# Patient Record
Sex: Female | Born: 1960 | Race: Black or African American | Hispanic: No | Marital: Single | Smoking: Never smoker
Health system: Southern US, Community
[De-identification: ages and names within clinical notes are randomized; demographics above are authoritative.]

---

## 1996-08-09 HISTORY — PX: BREAST BIOPSY: SHX20

## 2005-05-11 ENCOUNTER — Ambulatory Visit: Payer: Self-pay | Admitting: General Surgery

## 2006-01-18 ENCOUNTER — Ambulatory Visit: Payer: Self-pay | Admitting: Family Medicine

## 2006-05-16 ENCOUNTER — Ambulatory Visit: Payer: Self-pay | Admitting: General Surgery

## 2008-05-01 ENCOUNTER — Ambulatory Visit: Payer: Self-pay | Admitting: Family Medicine

## 2009-05-05 ENCOUNTER — Ambulatory Visit: Payer: Self-pay | Admitting: Family Medicine

## 2011-09-08 ENCOUNTER — Ambulatory Visit: Payer: Self-pay | Admitting: Family Medicine

## 2012-07-19 ENCOUNTER — Ambulatory Visit: Payer: Self-pay | Admitting: Internal Medicine

## 2012-07-20 ENCOUNTER — Ambulatory Visit: Payer: Self-pay | Admitting: Family Medicine

## 2012-08-07 ENCOUNTER — Ambulatory Visit: Payer: Self-pay | Admitting: Family Medicine

## 2012-09-26 ENCOUNTER — Ambulatory Visit: Payer: Self-pay | Admitting: Family Medicine

## 2012-10-07 ENCOUNTER — Ambulatory Visit: Payer: Self-pay | Admitting: Family Medicine

## 2012-10-23 ENCOUNTER — Ambulatory Visit: Payer: Self-pay | Admitting: Family Medicine

## 2012-11-07 ENCOUNTER — Ambulatory Visit: Payer: Self-pay | Admitting: Family Medicine

## 2013-01-25 ENCOUNTER — Ambulatory Visit: Payer: Self-pay | Admitting: Family Medicine

## 2013-02-19 ENCOUNTER — Ambulatory Visit: Payer: Self-pay | Admitting: Gastroenterology

## 2013-03-26 ENCOUNTER — Ambulatory Visit: Payer: Self-pay | Admitting: Family Medicine

## 2013-04-09 ENCOUNTER — Ambulatory Visit: Payer: Self-pay | Admitting: Family Medicine

## 2013-04-27 ENCOUNTER — Ambulatory Visit: Payer: Self-pay | Admitting: Family Medicine

## 2013-11-12 ENCOUNTER — Ambulatory Visit: Payer: Self-pay | Admitting: Family Medicine

## 2014-11-14 ENCOUNTER — Ambulatory Visit
Admit: 2014-11-14 | Disposition: A | Discharge status: Home / Self Care | Payer: Self-pay | Attending: Family Medicine | Admitting: Family Medicine

## 2015-09-26 ENCOUNTER — Other Ambulatory Visit: Payer: Self-pay | Admitting: Family Medicine

## 2015-09-26 DIAGNOSIS — Z1231 Encounter for screening mammogram for malignant neoplasm of breast: Secondary | ICD-10-CM

## 2015-09-26 DIAGNOSIS — Z7189 Other specified counseling: Secondary | ICD-10-CM | POA: Insufficient documentation

## 2015-09-26 DIAGNOSIS — Z7185 Encounter for immunization safety counseling: Secondary | ICD-10-CM | POA: Insufficient documentation

## 2015-11-17 ENCOUNTER — Ambulatory Visit: Payer: Self-pay

## 2015-11-19 ENCOUNTER — Ambulatory Visit
Admission: RE | Admit: 2015-11-19 | Discharge: 2015-11-19 | Disposition: A | Discharge status: Home / Self Care | Payer: BLUE CROSS/BLUE SHIELD | Source: Ambulatory Visit | Attending: Family Medicine | Admitting: Family Medicine

## 2015-11-19 DIAGNOSIS — Z1231 Encounter for screening mammogram for malignant neoplasm of breast: Secondary | ICD-10-CM | POA: Diagnosis not present

## 2015-11-19 DIAGNOSIS — N6489 Other specified disorders of breast: Secondary | ICD-10-CM | POA: Insufficient documentation

## 2016-09-29 DIAGNOSIS — E669 Obesity, unspecified: Secondary | ICD-10-CM | POA: Insufficient documentation

## 2016-12-14 ENCOUNTER — Other Ambulatory Visit: Payer: Self-pay | Admitting: Family Medicine

## 2016-12-14 DIAGNOSIS — Z1231 Encounter for screening mammogram for malignant neoplasm of breast: Secondary | ICD-10-CM

## 2017-01-04 ENCOUNTER — Ambulatory Visit
Admission: RE | Admit: 2017-01-04 | Discharge: 2017-01-04 | Disposition: A | Discharge status: Home / Self Care | Payer: BLUE CROSS/BLUE SHIELD | Source: Ambulatory Visit | Attending: Family Medicine | Admitting: Family Medicine

## 2017-01-04 DIAGNOSIS — Z1231 Encounter for screening mammogram for malignant neoplasm of breast: Secondary | ICD-10-CM | POA: Diagnosis not present

## 2017-10-13 DIAGNOSIS — R7303 Prediabetes: Secondary | ICD-10-CM | POA: Insufficient documentation

## 2018-02-03 ENCOUNTER — Other Ambulatory Visit: Payer: Self-pay | Admitting: Family Medicine

## 2018-02-03 DIAGNOSIS — Z1231 Encounter for screening mammogram for malignant neoplasm of breast: Secondary | ICD-10-CM

## 2018-02-17 ENCOUNTER — Ambulatory Visit
Admission: RE | Admit: 2018-02-17 | Discharge: 2018-02-17 | Disposition: A | Discharge status: Home / Self Care | Payer: BLUE CROSS/BLUE SHIELD | Source: Ambulatory Visit | Attending: Family Medicine | Admitting: Family Medicine

## 2018-02-17 DIAGNOSIS — Z1231 Encounter for screening mammogram for malignant neoplasm of breast: Secondary | ICD-10-CM | POA: Insufficient documentation

## 2018-02-20 ENCOUNTER — Encounter: Payer: Self-pay | Admitting: Podiatry

## 2018-02-20 ENCOUNTER — Ambulatory Visit: Payer: BLUE CROSS/BLUE SHIELD | Admitting: Podiatry

## 2018-02-20 DIAGNOSIS — M2141 Flat foot [pes planus] (acquired), right foot: Secondary | ICD-10-CM

## 2018-02-20 DIAGNOSIS — M2142 Flat foot [pes planus] (acquired), left foot: Secondary | ICD-10-CM

## 2018-02-20 DIAGNOSIS — L84 Corns and callosities: Secondary | ICD-10-CM | POA: Diagnosis not present

## 2018-02-20 DIAGNOSIS — M2010 Hallux valgus (acquired), unspecified foot: Secondary | ICD-10-CM | POA: Diagnosis not present

## 2018-02-20 NOTE — Progress Notes (Signed)
This patient presents to the office for  painful calluses on both big toes.  She says that these calluses have been painful for the last 7 months.  She says that she has started exercising in the calluses have quickly developed.  She presents the office today for an evaluation and treatment of her painful calluses.  Patient is borderline diabetic.  She says that she has previously obtained orthoses through this office. She presents for an evaluation and treatment of  her painful callus.  General Appearance  Alert, conversant and in no acute stress.  Vascular  Dorsalis pedis and posterior tibial  pulses are palpable  bilaterally.  Capillary return is within normal limits  bilaterally. Temperature is within normal limits  bilaterally.  Neurologic  Senn-Weinstein monofilament wire test within normal limits  bilaterally. Muscle power within normal limits bilaterally.  Nails normal nails noted with no evidence of bacterial or fungal infection.  Orthopedic  No limitations of motion of motion feet .  No crepitus or effusions noted.  Pes  planus bilaterally.  HAV  B/L.  Hallux Interphalangeus.  B/l.  Skin  normotropic skin with no porokeratosis noted bilaterally.  No signs of infections or ulcers noted.    Pinch callus  B/L  IE  Debride callus  B/L.  Discussed formation of pinch callus both feet.   Helane GuntherGregory Judea Riches DPM

## 2018-12-04 DIAGNOSIS — F411 Generalized anxiety disorder: Secondary | ICD-10-CM | POA: Insufficient documentation

## 2019-06-11 DIAGNOSIS — K219 Gastro-esophageal reflux disease without esophagitis: Secondary | ICD-10-CM | POA: Insufficient documentation

## 2019-06-12 ENCOUNTER — Other Ambulatory Visit: Payer: Self-pay | Admitting: Family Medicine

## 2019-06-12 DIAGNOSIS — G8929 Other chronic pain: Secondary | ICD-10-CM

## 2019-06-21 ENCOUNTER — Ambulatory Visit
Admission: RE | Admit: 2019-06-21 | Discharge: 2019-06-21 | Disposition: A | Discharge status: Home / Self Care | Payer: BC Managed Care – PPO | Source: Ambulatory Visit | Attending: Family Medicine | Admitting: Family Medicine

## 2019-06-21 ENCOUNTER — Other Ambulatory Visit: Payer: Self-pay

## 2019-06-21 DIAGNOSIS — G8929 Other chronic pain: Secondary | ICD-10-CM | POA: Diagnosis present

## 2019-06-21 DIAGNOSIS — M545 Low back pain: Secondary | ICD-10-CM | POA: Diagnosis not present

## 2019-08-06 ENCOUNTER — Other Ambulatory Visit: Payer: Self-pay | Admitting: Family Medicine

## 2019-08-06 DIAGNOSIS — Z1231 Encounter for screening mammogram for malignant neoplasm of breast: Secondary | ICD-10-CM

## 2019-08-13 ENCOUNTER — Ambulatory Visit
Admission: RE | Admit: 2019-08-13 | Discharge: 2019-08-13 | Disposition: A | Discharge status: Home / Self Care | Payer: BC Managed Care – PPO | Source: Ambulatory Visit | Attending: Family Medicine | Admitting: Family Medicine

## 2019-08-13 DIAGNOSIS — Z1231 Encounter for screening mammogram for malignant neoplasm of breast: Secondary | ICD-10-CM | POA: Diagnosis present

## 2019-08-14 ENCOUNTER — Ambulatory Visit
Admission: RE | Admit: 2019-08-14 | Discharge: 2019-08-14 | Disposition: A | Discharge status: Home / Self Care | Payer: BC Managed Care – PPO | Source: Ambulatory Visit | Attending: Urology | Admitting: Urology

## 2019-08-14 ENCOUNTER — Ambulatory Visit (INDEPENDENT_AMBULATORY_CARE_PROVIDER_SITE_OTHER): Payer: BC Managed Care – PPO | Admitting: Urology

## 2019-08-14 ENCOUNTER — Other Ambulatory Visit: Payer: Self-pay

## 2019-08-14 ENCOUNTER — Encounter: Payer: Self-pay | Admitting: Urology

## 2019-08-14 DIAGNOSIS — R109 Unspecified abdominal pain: Secondary | ICD-10-CM | POA: Diagnosis not present

## 2019-08-14 DIAGNOSIS — R319 Hematuria, unspecified: Secondary | ICD-10-CM

## 2019-08-14 DIAGNOSIS — N200 Calculus of kidney: Secondary | ICD-10-CM

## 2019-08-14 DIAGNOSIS — R3129 Other microscopic hematuria: Secondary | ICD-10-CM | POA: Diagnosis not present

## 2019-08-14 DIAGNOSIS — R350 Frequency of micturition: Secondary | ICD-10-CM | POA: Diagnosis not present

## 2019-08-14 LAB — URINALYSIS, COMPLETE
Bilirubin, UA: NEGATIVE
Glucose, UA: NEGATIVE
Ketones, UA: NEGATIVE
Nitrite, UA: NEGATIVE
Protein,UA: NEGATIVE
Specific Gravity, UA: 1.02 (ref 1.005–1.030)
Urobilinogen, Ur: 4 mg/dL — ABNORMAL HIGH (ref 0.2–1.0)
pH, UA: 7 (ref 5.0–7.5)

## 2019-08-14 LAB — MICROSCOPIC EXAMINATION

## 2019-08-14 NOTE — Progress Notes (Signed)
08/14/2019 3:01 PM   Eileen Lopez 03/01/1961 [id number]  Referring provider: Dion Body, MD [facility] [place],  [place] [address]  Chief Complaint  Patient presents with  . Hematuria    HPI: 59 year old female who presents today for further evaluation of microscopic hematuria and kidney stones.  She reports that she was told about 20 years ago that she had blood in her urine.  She recalls being evaluated by urologist that time and undergoing what sounds like cystoscopy as well as possibly an IVP.  She reports that the work-up was negative.  More recently, she is have persistent microscopic blood in her urine.  She is also having right flank pain which lasted several months.  Over the past month or 2, she is now having left-sided flank pain which comes and goes.  It wraps around to her left lower quadrant.  It is unrelated to activity.  Its not exacerbated by anything.  It does not radiate to her buttock or down her back.  No associated urinary symptoms.  She had a CT of the lumbar spine without contrast on 06/21/2019 for further evaluation of her back pain primarily.  Incidentally, bilateral lower pole renal calculi were identified.  She denies a personal history of kidney stones.  No history of smoking or chemical exposures.  She is to work as a Pharmacist, hospital.  She has no urinary complaints today.   PMH: No past medical history on file.  Surgical History: Past Surgical History:  Procedure Laterality Date  . BREAST BIOPSY Right 1998   Negative    Home Medications:  Allergies as of 08/14/2019      Reactions   Tamsulosin Other (See Comments)   Amoxicillin Itching, Rash      Medication List       Accurate as of August 14, 2019 11:59 PM. If you have any questions, ask your nurse or doctor.        PROBIOTIC ADVANCED PO Take by mouth.       Allergies:  Allergies  Allergen Reactions  . Tamsulosin Other (See Comments)  .  Amoxicillin Itching and Rash    Family History: Family History  Problem Relation Age of Onset  . Breast cancer Maternal Aunt 60    Social History:  reports that she has never smoked. She has never used smokeless tobacco. She reports current alcohol use. She reports that she does not use drugs.  ROS: UROLOGY Frequent Urination?: No Hard to postpone urination?: No Burning/pain with urination?: No Get up at night to urinate?: No Leakage of urine?: No Urine stream starts and stops?: No Trouble starting stream?: No Do you have to strain to urinate?: No Blood in urine?: No Urinary tract infection?: No Sexually transmitted disease?: No Injury to kidneys or bladder?: No Painful intercourse?: No Weak stream?: No Currently pregnant?: No Vaginal bleeding?: No Last menstrual period?: n  Gastrointestinal Nausea?: No Vomiting?: No Indigestion/heartburn?: Yes Diarrhea?: No Constipation?: No  Constitutional Fever: No Night sweats?: No Weight loss?: No Fatigue?: No  Skin Skin rash/lesions?: No Itching?: No  Eyes Blurred vision?: No Double vision?: No  Ears/Nose/Throat Sore throat?: No Sinus problems?: No  Hematologic/Lymphatic Swollen glands?: No Easy bruising?: No  Cardiovascular Leg swelling?: No Chest pain?: No  Respiratory Cough?: No Shortness of breath?: No  Endocrine Excessive thirst?: No  Musculoskeletal Back pain?: No Joint pain?: No  Neurological Headaches?: No Dizziness?: No  Psychologic Depression?: No Anxiety?: No  Physical Exam: BP (!) 162/97  Pulse 93   Ht 5\' 6"  (1.676 m)   Wt 211 lb (95.7 kg)   BMI 34.06 kg/m   Constitutional:  Alert and oriented, No acute distress. HEENT: [place] AT, moist mucus membranes.  Trachea midline, no masses. Cardiovascular: No clubbing, cyanosis, or edema. Respiratory: Normal respiratory effort, no increased work of breathing. GI: Abdomen is soft, nontender, nondistended, no abdominal masses GU: No  CVA tenderness Skin: No rashes, bruises or suspicious lesions. Neurologic: Grossly intact, no focal deficits, moving all 4 extremities. Psychiatric: Normal mood and affect.  Laboratory Data: Creatinine 1.0 11/2018 Hemoglobin 13.1  Urinalysis Results for orders placed or performed in visit on 08/14/19  Microscopic Examination   URINE  Result Value Ref Range   WBC, UA 0-5 0 - 5 /hpf   RBC 11-30 (A) 0 - 2 /hpf   Epithelial Cells (non renal) 0-10 0 - 10 /hpf   Bacteria, UA Few None seen/Few  Urinalysis, Complete  Result Value Ref Range   Specific Gravity, UA 1.020 1.005 - 1.030   pH, UA 7.0 5.0 - 7.5   Color, UA Yellow Yellow   Appearance Ur Clear Clear   Leukocytes,UA Trace (A) Negative   Protein,UA Negative Negative/Trace   Glucose, UA Negative Negative   Ketones, UA Negative Negative   RBC, UA 2+ (A) Negative   Bilirubin, UA Negative Negative   Urobilinogen, Ur 4.0 (H) 0.2 - 1.0 mg/dL   Nitrite, UA Negative Negative   Microscopic Examination See below:    Multiple different urinalysis reviewed from 07/02/2019 indicating 4-10 red blood cells, 06/2019 with 4-10 as well as 12/2018 with 4-11 red blood cells.  No associated infection with these urinalyses.  Pertinent Imaging: CT of the lumbar spine was personally reviewed, stones were visualized in the lower pole but the anatomy was difficult to discern as well as stone size and density due to the nature of the study  Assessment & Plan:    1. Microscopic hematuria Given the degree of her hematuria and lack of risk factors, she falls into the intermediate risk for hematuria evaluation based on new AUA guidelines.  She would like to avoid CT scan if possible thus have recommended a KUB to evaluate her stones as well as ultrasound to rule out any underlying renal masses.  Would consider cystoscopy pending her overall stone burden and desire for stone prevention in the future.  If she ultimately elects to pursue surgical coverage  for stones, cystoscopy could be performed at that time to complete her microscopic hematuria evaluation.  See below for details. - Urinalysis, Complete - 01/2019 Renal; Future - DG Abd 1 View; Future  2. Left flank pain We discussed the nature of her left flank pain is more likely musculoskeletal however could be related to her stones.  We also discussed this though however that nonobstructing lower pole stones are typically not associated with pain.  We discussed that if we end up treating her stones, it may or may not resolve her back pain symptoms.  3. Kidney stones KUB today to assess overall stone burden need for intervention   Return for KUB today and f/u KUB/ RUS virutal visit after studies .  [place], MD  [facility] [address], [address] [place], [place] [phone]

## 2019-08-24 ENCOUNTER — Other Ambulatory Visit: Payer: Self-pay

## 2019-08-24 ENCOUNTER — Ambulatory Visit
Admission: RE | Admit: 2019-08-24 | Discharge: 2019-08-24 | Disposition: A | Discharge status: Home / Self Care | Payer: BC Managed Care – PPO | Source: Ambulatory Visit | Attending: Urology | Admitting: Urology

## 2019-08-24 DIAGNOSIS — R3129 Other microscopic hematuria: Secondary | ICD-10-CM | POA: Insufficient documentation

## 2019-08-27 ENCOUNTER — Ambulatory Visit (INDEPENDENT_AMBULATORY_CARE_PROVIDER_SITE_OTHER): Payer: BC Managed Care – PPO

## 2019-08-27 ENCOUNTER — Encounter: Payer: Self-pay | Admitting: Podiatry

## 2019-08-27 ENCOUNTER — Ambulatory Visit: Payer: BC Managed Care – PPO | Admitting: Podiatry

## 2019-08-27 ENCOUNTER — Other Ambulatory Visit: Payer: Self-pay

## 2019-08-27 DIAGNOSIS — M722 Plantar fascial fibromatosis: Secondary | ICD-10-CM | POA: Diagnosis not present

## 2019-08-27 MED ORDER — CELECOXIB 100 MG PO CAPS: 100.0000 mg | ORAL_CAPSULE | Freq: Every day | ORAL | 3 refills | Status: DC

## 2019-08-27 MED ORDER — METHYLPREDNISOLONE 4 MG PO TABS: ORAL_TABLET | ORAL | 0 refills | Status: DC

## 2019-08-27 NOTE — Patient Instructions (Signed)

## 2019-08-27 NOTE — Progress Notes (Signed)
She presents today chief complaint of painful left foot.  States that she continues to wear her orthotics on a regular basis has a new pair of shoes states that maybe her foot started hurting because she was walking 3 miles a day and an older pair shoes she states that she really cannot take ibuprofen and NSAIDs because of horrible reflux.  She denies fever chills nausea vomiting muscle aches pains trauma.  ROS: Denies fever chills nausea vomiting muscle aches pains calf pain back pain chest pain shortness of breath.  Objective: Vital signs are stable alert and oriented x3.  Pulses are palpable.  Capillary fill time is immediate.  Neurologic sensorium is intact.  Deep tendon reflexes are intact bilateral.  Muscle strength is normal and symmetrical bilateral.  Good dorsiflexion plantarflexion inversion eversion flexible pes planus is noted on physical exam mild ankle equinus is noted left.  She has pain on palpation medial calcaneal tubercle left.  Radiographs taken today demonstrate pes planus left.  Soft tissue increase in density plantar fascial kidney insertion site.  No acute findings.  Assessment: Pes planus with plantar fasciitis left.  Plan: Discussed etiology pathology conservative or surgical therapies.  We are going to try starting her on methylprednisolone 4 mg over 6 days taper and I will also try starting her on Celebrex 100 mg daily after the methylprednisolone.  I also injected the area today with 20 mg Kenalog 5 mg Marcaine point maximal tenderness of the left heel.  This is performed after Betadine skin prep.  She tolerated procedure well.  Placed in a plantar fascial brace to be followed by a night splint.  Discussed appropriate shoe gear stretching exercises ice therapy and shoe gear modifications.  I would like to follow-up with her in about 1 month to make sure she is doing well should she have questions or concerns she will notify us immediately.

## 2019-09-14 ENCOUNTER — Telehealth (INDEPENDENT_AMBULATORY_CARE_PROVIDER_SITE_OTHER): Payer: BC Managed Care – PPO | Admitting: Urology

## 2019-09-14 ENCOUNTER — Other Ambulatory Visit: Payer: Self-pay

## 2019-09-14 DIAGNOSIS — R3129 Other microscopic hematuria: Secondary | ICD-10-CM | POA: Diagnosis not present

## 2019-09-14 DIAGNOSIS — R109 Unspecified abdominal pain: Secondary | ICD-10-CM

## 2019-09-14 DIAGNOSIS — N281 Cyst of kidney, acquired: Secondary | ICD-10-CM

## 2019-09-14 DIAGNOSIS — N200 Calculus of kidney: Secondary | ICD-10-CM

## 2019-09-14 NOTE — Progress Notes (Signed)
Virtual Visit via Telephone Note  I connected with Eileen Lopez on 09/14/19 at 11:30 AM EST by telephone and verified that I am speaking with the correct person using two identifiers.  Location: Patient: Work Provider: Marketing executive,    I discussed the limitations, risks, security and privacy concerns of performing an evaluation and management service by telephone and the availability of in person appointments. I also discussed with the patient that there may be a patient responsible charge related to this service. The patient expressed understanding and agreed to proceed.   History of Present Illness: 59 year old female who presents today via virtual visit discuss her renal ultrasound results.  She was referred for microscopic hematuria as well as an incidental kidney stones on CT of the lumbar spine.  She does have intermittent left flank pain which comes and goes without any alleviating or exacerbating symptoms.  KUB indicates a 1.3 cm stone of the left lower pole as well as a 7 mm in the right lower pole.  There are also smaller nonobstructing stones on the right as well.  Renal ultrasound indicated a 1.5 cm left lower pole cyst with a small dependent calcification.  Renal ultrasound and KUB were personally reviewed.  Plan to reschedule patient.  She denies personal history of kidney stones.  She is never had surgical invention for stones.   Observations/Objective: Inquisitive, asking the questions  Assessment and Plan:  1. Microscopic hematuria We discussed the differential diagnosis of laparoscopic hematuria again today  May be related to kidney stones however would recommend bladder evaluation.  If she elects to pursue ureteroscopy, can evaluate the bladder intraoperatively.  If not, I would recommend office cystoscopy to which she is agreeable.  To let us know how she like to proceed as below.  2. Left flank pain Lengthy discussion today about the intermittent nature of her left  flank pain  Discussed the natural history of generally do not cause discomfort but there is no way to 100% rule out a kidney stone the etiology of her discomfort intolerance reviewed.  I was very clear today that I do not suspect this is the etiology of her pain.  3. Kidney stones Relatively large kidney stone burden, left greater than right as above  We discussed various management options including observation to assess for interval growth, ureteroscopy as well as staged ESWL.  Risk and benefits of each were discussed.  Given that she has numerous stents bilaterally, would likely recommend bilateral ureteroscopy.  Risks and benefits of ureteroscopy were reviewed including but not limited to infection, bleeding, pain, ureteral injury which could require open surgery versus prolonged indwelling if ureteral perforation occurs, persistent stone disease, requirement for staged procedure, possible stent, and global anesthesia risks. Patient expressed understanding and desires to proceed with ureteroscopy.  She will let us know if and when she like to pursue this otherwise will consider office cystoscopy.  We briefly discussed today dietary intervention for stone prevention.  4. Renal cyst Incidental renal cyst, dependent calcification likely proximity of stone  If she does elect to pursue ureteroscopy, can follow this up with her postoperative renal ultrasound to help clarify this issue   Follow Up Instructions: Patient will sign up for my chart today and let us know early next week how she like to proceed   I discussed the assessment and treatment plan with the patient. The patient was provided an opportunity to ask questions and all were answered. The patient agreed with the plan and demonstrated  an understanding of the instructions.   The patient was advised to call back or seek an in-person evaluation if the symptoms worsen or if the condition fails to improve as anticipated.  I provided  22 minutes of non-face-to-face time during this encounter.   Vanna Scotland, MD

## 2019-09-19 ENCOUNTER — Telehealth: Payer: Self-pay | Admitting: *Deleted

## 2019-09-19 NOTE — Telephone Encounter (Addendum)
Spoke with patient, she would like to proceed with Ureteroscopy.   ----- Message from Vanna Scotland, MD sent at 09/16/2019  2:18 PM EST ----- Would you mind following up this nice lady sometime this week?  She is thinking about pursuing bilateral ureteroscopy.  If she does not want to do this, then we need to schedule her for cystoscopy.

## 2019-09-21 ENCOUNTER — Other Ambulatory Visit: Payer: Self-pay | Admitting: Radiology

## 2019-09-21 DIAGNOSIS — N200 Calculus of kidney: Secondary | ICD-10-CM

## 2019-09-24 ENCOUNTER — Encounter: Payer: Self-pay | Admitting: Podiatry

## 2019-09-24 ENCOUNTER — Other Ambulatory Visit: Payer: Self-pay

## 2019-09-24 ENCOUNTER — Ambulatory Visit: Payer: BC Managed Care – PPO | Admitting: Podiatry

## 2019-09-24 DIAGNOSIS — M722 Plantar fascial fibromatosis: Secondary | ICD-10-CM | POA: Diagnosis not present

## 2019-09-24 NOTE — Progress Notes (Signed)
She presents today for follow-up of her plantar fasciitis she states that is feeling about 60% improved.  Objective: Vital signs are stable alert and oriented x3.  She has minimal pain on the palpation medial calcaneal tubercle of the left foot.  Assessment: Resolving plantar fasciitis by about 60% left heel.  Plan: Because she does not want to take medications i.e. the Celebrex or the methylprednisolone that I had prescribed we went ahead and reinjected the left heel today with 20 mg Kenalog 5 mg Marcaine to the point of maximal tenderness left.  She tolerated procedure well without complications I will follow-up with her in about a month.

## 2019-09-25 ENCOUNTER — Other Ambulatory Visit: Payer: Self-pay

## 2019-09-25 DIAGNOSIS — R3129 Other microscopic hematuria: Secondary | ICD-10-CM

## 2019-09-26 ENCOUNTER — Other Ambulatory Visit: Payer: BC Managed Care – PPO

## 2019-10-01 ENCOUNTER — Other Ambulatory Visit: Payer: BC Managed Care – PPO

## 2019-10-02 ENCOUNTER — Other Ambulatory Visit: Payer: BC Managed Care – PPO

## 2019-10-04 ENCOUNTER — Encounter: Admission: RE | Payer: Self-pay | Source: Home / Self Care

## 2019-10-04 ENCOUNTER — Ambulatory Visit: Admission: RE | Admit: 2019-10-04 | Payer: BC Managed Care – PPO | Source: Home / Self Care | Admitting: Urology

## 2019-10-04 SURGERY — CYSTOSCOPY/URETEROSCOPY/HOLMIUM LASER/STENT PLACEMENT
Anesthesia: Choice | Laterality: Bilateral

## 2019-10-22 ENCOUNTER — Encounter: Payer: BC Managed Care – PPO | Admitting: Podiatry

## 2019-12-27 DIAGNOSIS — Z8249 Family history of ischemic heart disease and other diseases of the circulatory system: Secondary | ICD-10-CM | POA: Insufficient documentation

## 2019-12-27 DIAGNOSIS — R030 Elevated blood-pressure reading, without diagnosis of hypertension: Secondary | ICD-10-CM | POA: Insufficient documentation

## 2020-01-09 ENCOUNTER — Ambulatory Visit: Payer: BC Managed Care – PPO | Admitting: Podiatry

## 2020-01-09 ENCOUNTER — Other Ambulatory Visit: Payer: Self-pay

## 2020-01-09 DIAGNOSIS — M722 Plantar fascial fibromatosis: Secondary | ICD-10-CM

## 2020-01-09 DIAGNOSIS — M67471 Ganglion, right ankle and foot: Secondary | ICD-10-CM | POA: Diagnosis not present

## 2020-01-09 NOTE — Patient Instructions (Signed)
Pre-Operative Instructions  Congratulations, you have decided to take an important step towards improving your quality of life.  You can be assured that the doctors and staff at [facility] will be with you every step of the way.  Here are some important things you should know:  1. Plan to be at the surgery center/hospital at least 1 (one) hour prior to your scheduled time, unless otherwise directed by the surgical center/hospital staff.  You must have a responsible adult accompany you, remain during the surgery and drive you home.  Make sure you have directions to the surgical center/hospital to ensure you arrive on time. 2. If you are having surgery at [facility] or [facility], you will need a copy of your medical history and physical form from your family physician within one month prior to the date of surgery. We will give you a form for your primary physician to complete.  3. We make every effort to accommodate the date you request for surgery.  However, there are times where surgery dates or times have to be moved.  We will contact you as soon as possible if a change in schedule is required.   4. No aspirin/ibuprofen for one week before surgery.  If you are on aspirin, any non-steroidal anti-inflammatory medications (Mobic, Aleve, Ibuprofen) should not be taken seven (7) days prior to your surgery.  You make take Tylenol for pain prior to surgery.  5. Medications - If you are taking daily heart and blood pressure medications, seizure, reflux, allergy, asthma, anxiety, pain or diabetes medications, make sure you notify the surgery center/hospital before the day of surgery so they can tell you which medications you should take or avoid the day of surgery. 6. No food or drink after midnight the night before surgery unless directed otherwise by surgical center/hospital staff. 7. No alcoholic beverages 24-hours prior to surgery.  No smoking 24-hours prior or 24-hours after  surgery. 8. Wear loose pants or shorts. They should be loose enough to fit over bandages, boots, and casts. 9. Don't wear slip-on shoes. Sneakers are preferred. 10. Bring your boot with you to the surgery center/hospital.  Also bring crutches or a walker if your physician has prescribed it for you.  If you do not have this equipment, it will be provided for you after surgery. 11. If you have not been contacted by the surgery center/hospital by the day before your surgery, call to confirm the date and time of your surgery. 12. Leave-time from work may vary depending on the type of surgery you have.  Appropriate arrangements should be made prior to surgery with your employer. 13. Prescriptions will be provided immediately following surgery by your doctor.  Fill these as soon as possible after surgery and take the medication as directed. Pain medications will not be refilled on weekends and must be approved by the doctor. 14. Remove nail polish on the operative foot and avoid getting pedicures prior to surgery. 15. Wash the night before surgery.  The night before surgery wash the foot and leg well with water and the antibacterial soap provided. Be sure to pay special attention to beneath the toenails and in between the toes.  Wash for at least three (3) minutes. Rinse thoroughly with water and dry well with a towel.  Perform this wash unless told not to do so by your physician.  Enclosed: 1 Ice pack (please put in freezer the night before surgery)   1 Hibiclens skin cleaner     Pre-op instructions  If you have any questions regarding the instructions, please do not hesitate to call our office.  [place]: [address], [place], [place] [address] -- [phone]  [place]: [address] [place], [place] [address] -- [phone]  [place]: [address], [place], [place] [address] -- [phone]   Website: [email or web address] 

## 2020-01-11 ENCOUNTER — Telehealth: Payer: Self-pay

## 2020-01-11 NOTE — Telephone Encounter (Signed)
Received paperwork from Dr. Al Corpus for surgery. Left a message for the patient to call so we could get her surgery scheduled.

## 2020-01-11 NOTE — Progress Notes (Signed)
She presents today states my heel started hurting again about 3 to 4 weeks ago as she refers to the plantar fasciitis of the left heel.  She is also concerned about the large ganglion to the hallux right.  She states that her toe starting to turn to the side the bump is becoming more painful.  Objective: Vital signs are stable she is alert and oriented x3.  Pulses are palpable.  I have reviewed her past medical history medications allergies surgeries and social history.  Right hallux demonstrates hallux interphalangeal with a large 1 cm x 2.0 cm nonpulsatile ganglion type lesion medial aspect of the hallux.  It is fluctuant and translucent.  Left heel demonstrates pain to palpation medial calcaneal tubercle.  Assessment: Chronic intractable plantar fasciitis left.  Ganglion cyst with hallux interphalangeal right.  Plan: Discussed etiology pathology conservative versus surgical therapies at this point we consented her for excision of ganglion cyst medial aspect of the hallux.  I did express to her that the chances of this coming back would be greater because of the hallux interphalangeal and without a hallux interphalangeal joint fusion we could not guarantee the fact that this would not return.  She understands this and is amenable to it we did discuss the possible postop complications which may include but are not limited to postop pain bleeding swelling infection recurrence need for further surgery overcorrection under correction.  We provided her with information regarding the surgery center instructions for the morning of surgery and provided her with a Darco shoe for postop recovery.  I injected her left heel today with 20 mg of Kenalog 5 mg of Marcaine point maximal tenderness.  She tolerated procedure well without complications.  She was seen by Raiford Noble today for orthotics and scheduled for pickup.  I will follow-up with her in the near future for surgical intervention.  I will follow-up with her in  the next few weeks.

## 2020-02-13 ENCOUNTER — Ambulatory Visit: Payer: BC Managed Care – PPO | Admitting: Orthotics

## 2020-02-13 ENCOUNTER — Other Ambulatory Visit: Payer: Self-pay

## 2020-02-13 DIAGNOSIS — L84 Corns and callosities: Secondary | ICD-10-CM

## 2020-02-13 DIAGNOSIS — M67471 Ganglion, right ankle and foot: Secondary | ICD-10-CM

## 2020-02-13 NOTE — Progress Notes (Signed)
Patient came in today to pick up custom made foot orthotics.  The goals were accomplished and the patient reported no dissatisfaction with said orthotics.  Patient was advised of breakin period and how to report any issues. 

## 2020-02-19 ENCOUNTER — Telehealth: Payer: Self-pay

## 2020-02-19 NOTE — Telephone Encounter (Signed)
DOS 02/29/2020  EXC GANGLION TOE RT - 28092  [employer] EFFECTIVE DATE - 08/10/2019  PLAN DEDUCTIBLE - $300.00 W/$176.51 REMAINING OUT OF POCKET - $3500.00 W/ $2816.51 REMAINING COPAY $0.00 COINSURANCE - 20% PER SERVICE YEAR  NO AUTH REQUIRED PER [employer] WEBSITE

## 2020-02-28 ENCOUNTER — Other Ambulatory Visit: Payer: Self-pay | Admitting: Podiatry

## 2020-02-28 MED ORDER — CLINDAMYCIN HCL 150 MG PO CAPS: 150.0000 mg | ORAL_CAPSULE | Freq: Three times a day (TID) | ORAL | 0 refills | Status: DC

## 2020-02-28 MED ORDER — ONDANSETRON HCL 4 MG PO TABS: 4.0000 mg | ORAL_TABLET | Freq: Three times a day (TID) | ORAL | 0 refills | Status: DC | PRN

## 2020-02-28 MED ORDER — HYDROCODONE-ACETAMINOPHEN 10-325 MG PO TABS: 1.0000 | ORAL_TABLET | Freq: Four times a day (QID) | ORAL | 0 refills | Status: DC | PRN

## 2020-02-29 DIAGNOSIS — M67471 Ganglion, right ankle and foot: Secondary | ICD-10-CM

## 2020-03-05 ENCOUNTER — Encounter: Payer: Self-pay | Admitting: Podiatry

## 2020-03-10 ENCOUNTER — Other Ambulatory Visit: Payer: Self-pay

## 2020-03-10 ENCOUNTER — Ambulatory Visit (INDEPENDENT_AMBULATORY_CARE_PROVIDER_SITE_OTHER): Payer: BC Managed Care – PPO | Admitting: Podiatry

## 2020-03-10 ENCOUNTER — Encounter: Payer: Self-pay | Admitting: Podiatry

## 2020-03-10 DIAGNOSIS — M67471 Ganglion, right ankle and foot: Secondary | ICD-10-CM

## 2020-03-10 DIAGNOSIS — Z9889 Other specified postprocedural states: Secondary | ICD-10-CM

## 2020-03-10 NOTE — Progress Notes (Signed)
She presents today 10 days status post excision lesion medial aspect right hallux.  States that is doing good.  Objective: Vital signs are stable alert and oriented x3.  Pulses are palpable.  Dry sterile dressing intact was removed demonstrates no erythema cellulitis drainage or odor sutures are intact margins appear to be coapting.  Assessment: Well-healing surgical toe.  Plan: Redressed today dressed a compressive dressing and allow her to shower and get this wet she will place a small amount of Neosporin on it and I will follow-up with her in about a week for suture removal.

## 2020-03-19 ENCOUNTER — Other Ambulatory Visit: Payer: Self-pay

## 2020-03-19 ENCOUNTER — Encounter: Payer: Self-pay | Admitting: Podiatry

## 2020-03-19 ENCOUNTER — Ambulatory Visit (INDEPENDENT_AMBULATORY_CARE_PROVIDER_SITE_OTHER): Payer: BC Managed Care – PPO | Admitting: Podiatry

## 2020-03-19 DIAGNOSIS — Z9889 Other specified postprocedural states: Secondary | ICD-10-CM

## 2020-03-19 NOTE — Progress Notes (Signed)
She presents today date of surgery 02/29/2020 excision cyst plantar medial aspect right foot.  She states that is doing just fine little tender on the bottom but all in all much better.  Objective: Vital signs are stable alert oriented x3.  There is no erythema edema cellulitis drainage or odor sutures are intact removed once demonstrates no dehiscence.  Assessment: Well-healing surgical foot.  Plan: Encouraged her to wrap the toe with Coban daily and I will follow-up with her in a couple of weeks.

## 2020-03-28 ENCOUNTER — Encounter: Payer: Self-pay | Admitting: Podiatry

## 2020-04-02 ENCOUNTER — Ambulatory Visit (INDEPENDENT_AMBULATORY_CARE_PROVIDER_SITE_OTHER): Payer: BC Managed Care – PPO | Admitting: Podiatry

## 2020-04-02 ENCOUNTER — Other Ambulatory Visit: Payer: Self-pay

## 2020-04-02 ENCOUNTER — Encounter: Payer: Self-pay | Admitting: Podiatry

## 2020-04-02 DIAGNOSIS — M67471 Ganglion, right ankle and foot: Secondary | ICD-10-CM

## 2020-04-02 DIAGNOSIS — Z9889 Other specified postprocedural states: Secondary | ICD-10-CM

## 2020-04-02 NOTE — Progress Notes (Signed)
She presents today for follow-up of her excision soft tissue mass medial aspect of the hallux right.  States that still have 1 area is a little bit sensitive just at the proximal end of the incision as she points to it.  States that the callus is still little bit present but seems to be getting better date of surgery 02/29/2020.  Objective: Vital signs are stable alert oriented x3 there is no erythema edema cellulitis drainage or odor still some reactive hyperkeratosis is from where the flap was brought up but it really looks good there is no signs of infection no signs of skin breakdown.  To some slight sensitivity elongated proximal most aspect of the incision I saw no foreign body.  Muscle no stitch left in the area at all.  I do think is more than likely some scar tissue along the medial digital nerve.  Assessment: Well-healing surgical foot with exception of some soreness and tenderness along the incision site.  Plan: At this point I did recommend contrast baths I also recommended massage therapy and to the area.  I will follow-up with her in about a month to make sure she is healing well.  If she notices any recurrence or any dehiscence she will notify us immediately.

## 2020-04-16 ENCOUNTER — Encounter: Payer: BC Managed Care – PPO | Admitting: Podiatry

## 2020-05-05 ENCOUNTER — Ambulatory Visit (INDEPENDENT_AMBULATORY_CARE_PROVIDER_SITE_OTHER): Payer: BC Managed Care – PPO | Admitting: Podiatry

## 2020-05-05 ENCOUNTER — Other Ambulatory Visit: Payer: Self-pay

## 2020-05-05 ENCOUNTER — Encounter: Payer: Self-pay | Admitting: Podiatry

## 2020-05-05 DIAGNOSIS — Z9889 Other specified postprocedural states: Secondary | ICD-10-CM

## 2020-05-05 DIAGNOSIS — M67471 Ganglion, right ankle and foot: Secondary | ICD-10-CM

## 2020-05-05 NOTE — Progress Notes (Signed)
She presents today for postop visit date of surgery 02/29/2020 excision soft tissue tumor medial aspect second toe right foot.  She states that she is doing quite well with is back to walking half of her regular mileage.  She states this 1 area is a little bit tender as she points to the proximal portion of the incision.  And she does relate some nerve type pains occasionally.  Objective: Vital signs are stable she is alert and oriented x3.  Pulses are palpable.  The incision site is gone on to heal uneventfully and the skin is incorporating nicely.  There is a small area that appears to be either scar tissue or some residual tumor on the proximal portion however the way her toe was wrapped it could very well be fluctuance as well as the distal to that area.  Assessment: Well-healing surgical foot.  Plan: I expressed to her that if this should start to feel like it is growing or becoming more symptomatic similar to what she had in the past we need to get this sooner than later.  Also expressed to her that this very well could be the internal sutures with scar tissue at the proximal edge where usually there is a knot there is deep since we had to do a skin plasty there as well.  This should start to resolve if it does anything other than resolve she is to notify me.

## 2020-10-21 ENCOUNTER — Other Ambulatory Visit: Payer: Self-pay | Admitting: Family Medicine

## 2020-10-21 DIAGNOSIS — Z1231 Encounter for screening mammogram for malignant neoplasm of breast: Secondary | ICD-10-CM

## 2020-11-11 ENCOUNTER — Ambulatory Visit
Admission: RE | Admit: 2020-11-11 | Discharge: 2020-11-11 | Disposition: A | Discharge status: Home / Self Care | Payer: BC Managed Care – PPO | Source: Ambulatory Visit | Attending: Family Medicine | Admitting: Family Medicine

## 2020-11-11 ENCOUNTER — Other Ambulatory Visit: Payer: Self-pay

## 2020-11-11 DIAGNOSIS — Z1231 Encounter for screening mammogram for malignant neoplasm of breast: Secondary | ICD-10-CM | POA: Insufficient documentation

## 2021-10-20 DIAGNOSIS — E559 Vitamin D deficiency, unspecified: Secondary | ICD-10-CM | POA: Insufficient documentation

## 2021-10-20 DIAGNOSIS — E7800 Pure hypercholesterolemia, unspecified: Secondary | ICD-10-CM | POA: Insufficient documentation

## 2021-12-01 ENCOUNTER — Other Ambulatory Visit: Payer: Self-pay | Admitting: Cardiology

## 2021-12-01 ENCOUNTER — Other Ambulatory Visit (HOSPITAL_COMMUNITY): Payer: Self-pay | Admitting: Emergency Medicine

## 2021-12-01 DIAGNOSIS — R079 Chest pain, unspecified: Secondary | ICD-10-CM

## 2021-12-01 DIAGNOSIS — Z8249 Family history of ischemic heart disease and other diseases of the circulatory system: Secondary | ICD-10-CM

## 2021-12-01 MED ORDER — METOPROLOL TARTRATE 100 MG PO TABS: 100.0000 mg | ORAL_TABLET | Freq: Once | ORAL | 0 refills | Status: AC

## 2021-12-01 MED ORDER — IVABRADINE HCL 5 MG PO TABS: 15.0000 mg | ORAL_TABLET | Freq: Once | ORAL | 0 refills | Status: AC

## 2021-12-04 ENCOUNTER — Telehealth (HOSPITAL_COMMUNITY): Payer: Self-pay | Admitting: Emergency Medicine

## 2021-12-04 NOTE — Telephone Encounter (Signed)
Reaching out to patient to offer assistance regarding upcoming cardiac imaging study; pt verbalizes understanding of appt date/time, parking situation and where to check in, pre-test NPO status and medications ordered, and verified current allergies; name and call back number provided for further questions should they arise ?Rockwell Alexandria RN Navigator Cardiac Imaging ?[facility] ?[phone] office ?[phone] cell ? ?Denies iv issues ?100mg  metoprolol + 15mg  ivabradine ?[address] ?

## 2021-12-07 ENCOUNTER — Ambulatory Visit
Admission: RE | Admit: 2021-12-07 | Discharge: 2021-12-07 | Disposition: A | Discharge status: Home / Self Care | Payer: BC Managed Care – PPO | Source: Ambulatory Visit | Attending: Cardiology | Admitting: Cardiology

## 2021-12-07 DIAGNOSIS — Z8249 Family history of ischemic heart disease and other diseases of the circulatory system: Secondary | ICD-10-CM | POA: Insufficient documentation

## 2021-12-07 DIAGNOSIS — R079 Chest pain, unspecified: Secondary | ICD-10-CM | POA: Insufficient documentation

## 2021-12-07 MED ORDER — IOHEXOL 350 MG/ML SOLN
100.0000 mL | Freq: Once | INTRAVENOUS | Status: AC | PRN
  Administered 2021-12-07: 100 mL via INTRAVENOUS

## 2021-12-07 MED ORDER — METOPROLOL TARTRATE 5 MG/5ML IV SOLN
10.0000 mg | Freq: Once | INTRAVENOUS | Status: AC
  Administered 2021-12-07: 10 mg via INTRAVENOUS

## 2021-12-07 MED ORDER — NITROGLYCERIN 0.4 MG SL SUBL
0.8000 mg | SUBLINGUAL_TABLET | Freq: Once | SUBLINGUAL | Status: AC
  Administered 2021-12-07: 0.8 mg via SUBLINGUAL

## 2021-12-07 NOTE — Progress Notes (Signed)
Patient tolerated procedure well. Ambulate w/o difficulty. Denies light headedness or being dizzy. Sitting in chair drinking water provided. Encouraged to drink extra water today and reasoning explained. Verbalized understanding. All questions answered. ABC intact. No further needs. Discharge from procedure area w/o issues.   °

## 2021-12-16 ENCOUNTER — Other Ambulatory Visit: Payer: Self-pay | Admitting: Family Medicine

## 2021-12-16 DIAGNOSIS — Z1231 Encounter for screening mammogram for malignant neoplasm of breast: Secondary | ICD-10-CM

## 2022-01-14 ENCOUNTER — Ambulatory Visit
Admission: RE | Admit: 2022-01-14 | Discharge: 2022-01-14 | Disposition: A | Discharge status: Home / Self Care | Payer: BC Managed Care – PPO | Source: Ambulatory Visit | Attending: Family Medicine | Admitting: Family Medicine

## 2022-01-14 DIAGNOSIS — Z1231 Encounter for screening mammogram for malignant neoplasm of breast: Secondary | ICD-10-CM | POA: Diagnosis not present

## 2022-03-03 ENCOUNTER — Ambulatory Visit (INDEPENDENT_AMBULATORY_CARE_PROVIDER_SITE_OTHER): Payer: BC Managed Care – PPO

## 2022-03-03 ENCOUNTER — Ambulatory Visit: Payer: BC Managed Care – PPO | Admitting: Podiatry

## 2022-03-03 DIAGNOSIS — M76822 Posterior tibial tendinitis, left leg: Secondary | ICD-10-CM

## 2022-03-03 DIAGNOSIS — M722 Plantar fascial fibromatosis: Secondary | ICD-10-CM | POA: Diagnosis not present

## 2022-03-03 MED ORDER — TRIAMCINOLONE ACETONIDE 40 MG/ML IJ SUSP
20.0000 mg | Freq: Once | INTRAMUSCULAR | Status: AC
  Administered 2022-03-03: 20 mg

## 2022-03-03 NOTE — Progress Notes (Signed)
She presents today for follow-up of her left foot.  States that the heel started hurting and she is hurting right and here she points to the posterior tibial tendon denies any injury.  Objective: Vital signs are stable alert oriented x3 pulses are palpable.  She has pain on inversion against resistance.  She also has pain on palpation medial calcaneal tubercle.  She also has pes planovalgus with pain on palpation of the navicular tuberosity.  Radiographs taken today demonstrate an osseously mature individual with mild to moderate pes planovalgus small plantar distally oriented calcaneal spur with soft tissue increase in density plantar fascial calcaneal insertion site indicative of Planter fasciitis.  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  Plan: I injected the tendon sheath today with 5 mg of Kenalog and local anesthetic.  I injected the plantar fascia with 20 mg Kenalog and local anesthetic.  Tolerated the procedure well without complications.

## 2022-03-31 ENCOUNTER — Encounter: Payer: BC Managed Care – PPO | Admitting: Podiatry

## 2022-04-21 ENCOUNTER — Encounter: Payer: BC Managed Care – PPO | Admitting: Podiatry

## 2022-05-12 ENCOUNTER — Encounter: Payer: Self-pay | Admitting: Podiatry

## 2022-05-12 ENCOUNTER — Ambulatory Visit: Payer: BC Managed Care – PPO | Admitting: Podiatry

## 2022-05-12 DIAGNOSIS — M76822 Posterior tibial tendinitis, left leg: Secondary | ICD-10-CM

## 2022-05-12 DIAGNOSIS — M722 Plantar fascial fibromatosis: Secondary | ICD-10-CM

## 2022-05-12 MED ORDER — DEXAMETHASONE SODIUM PHOSPHATE 120 MG/30ML IJ SOLN
2.0000 mg | Freq: Once | INTRAMUSCULAR | Status: AC
  Administered 2022-05-12: 2 mg via INTRA_ARTICULAR

## 2022-05-12 NOTE — Progress Notes (Signed)
She presents today for follow-up of her Planter fasciitis she states that is doing better as is the posterior tibial tendinitis states that she is 60 to 70% improved.  Objective: Vital signs are stable she alert and oriented x3.  Still has some tenderness on palpation of the inferior most aspect of the posterior tibial tendon as it courses beneath the medial malleolus.  Assessment: Tendinitis.  Plan: Injected dexamethasone local anesthetic.  I am going to follow-up with her in a couple of weeks if not improved we will consider MRI and we did discuss orthotics which she will bring with her at that time.  May need to consider dress orthotics for her.

## 2022-06-23 ENCOUNTER — Ambulatory Visit: Payer: BC Managed Care – PPO | Admitting: Podiatry

## 2022-12-06 ENCOUNTER — Other Ambulatory Visit: Payer: Self-pay | Admitting: Family Medicine

## 2022-12-06 DIAGNOSIS — Z1231 Encounter for screening mammogram for malignant neoplasm of breast: Secondary | ICD-10-CM

## 2022-12-22 IMAGING — MG MM DIGITAL SCREENING BILAT W/ TOMO AND CAD
8 series · 8 of 24 positions shown · non-contrast
Comparison: Previous exam(s).

CLINICAL DATA: Screening.

EXAM:
DIGITAL SCREENING BILATERAL MAMMOGRAM WITH TOMOSYNTHESIS AND CAD
TECHNIQUE: Bilateral screening digital craniocaudal and mediolateral oblique
mammograms were obtained. Bilateral screening digital breast
tomosynthesis was performed. The images were evaluated with
computer-aided detection.

[R MLO synth-2D]
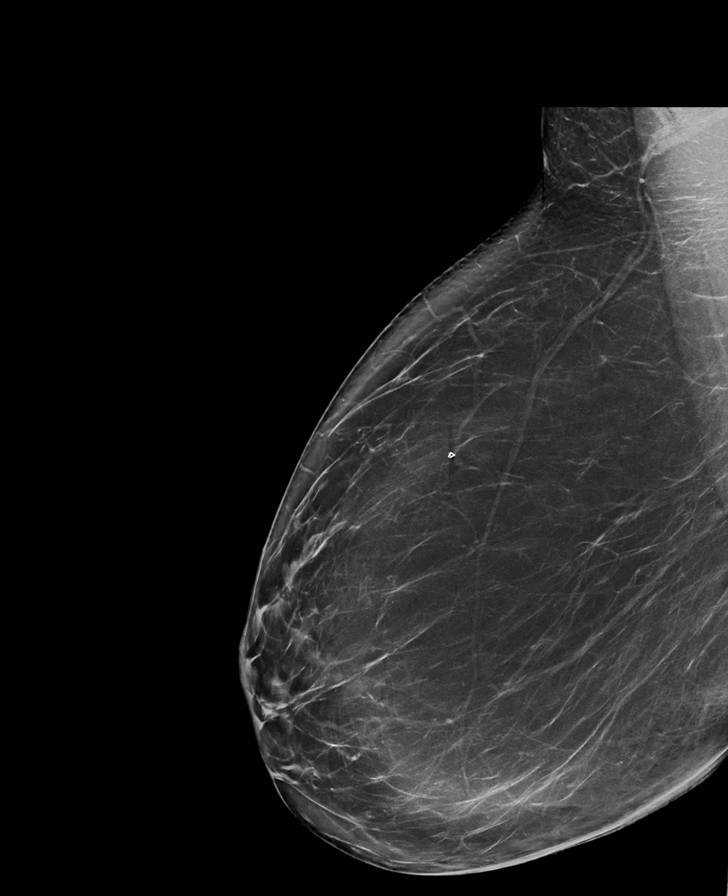

[R CC synth-2D]
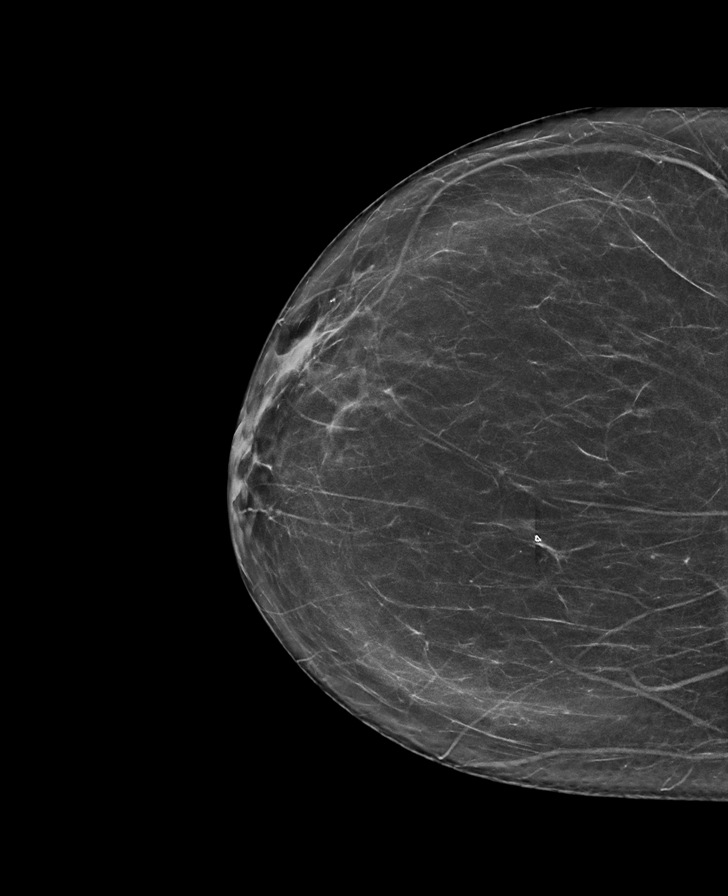

[L MLO synth-2D]
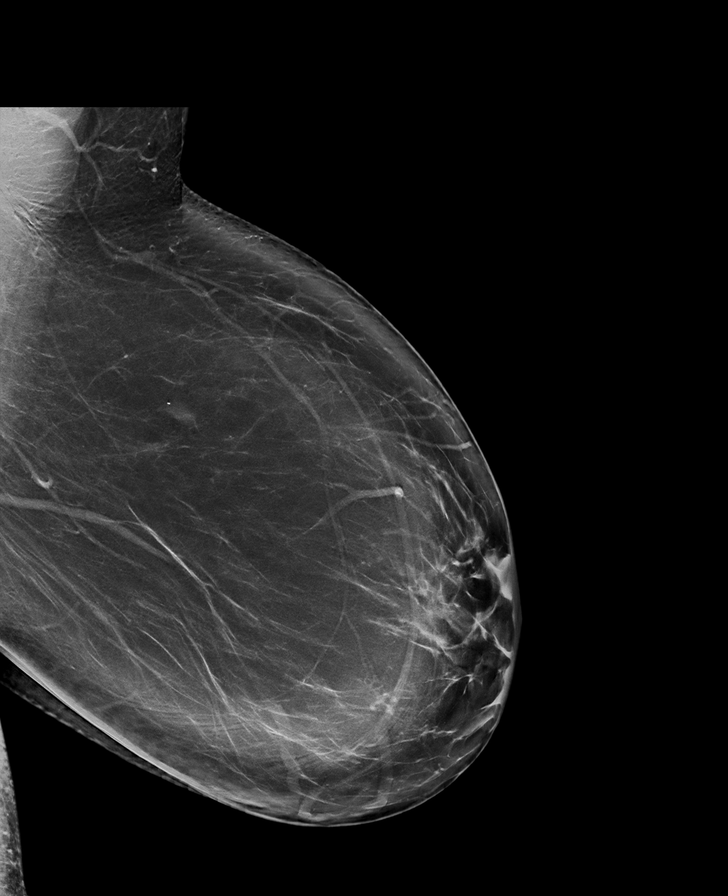

[L CC synth-2D]
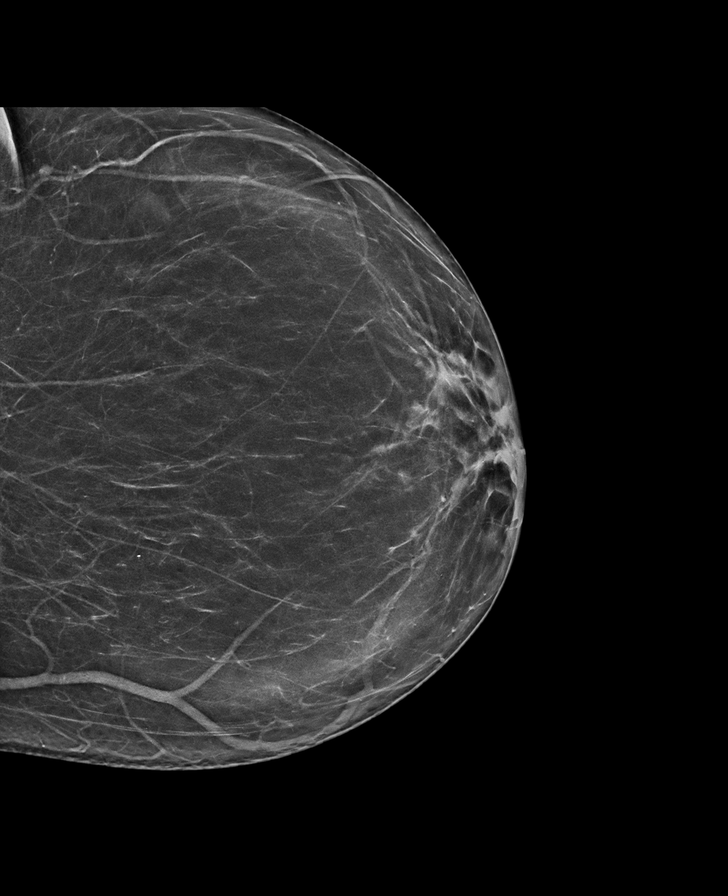

[R MLO tomo · tomo slice 45/89.0]
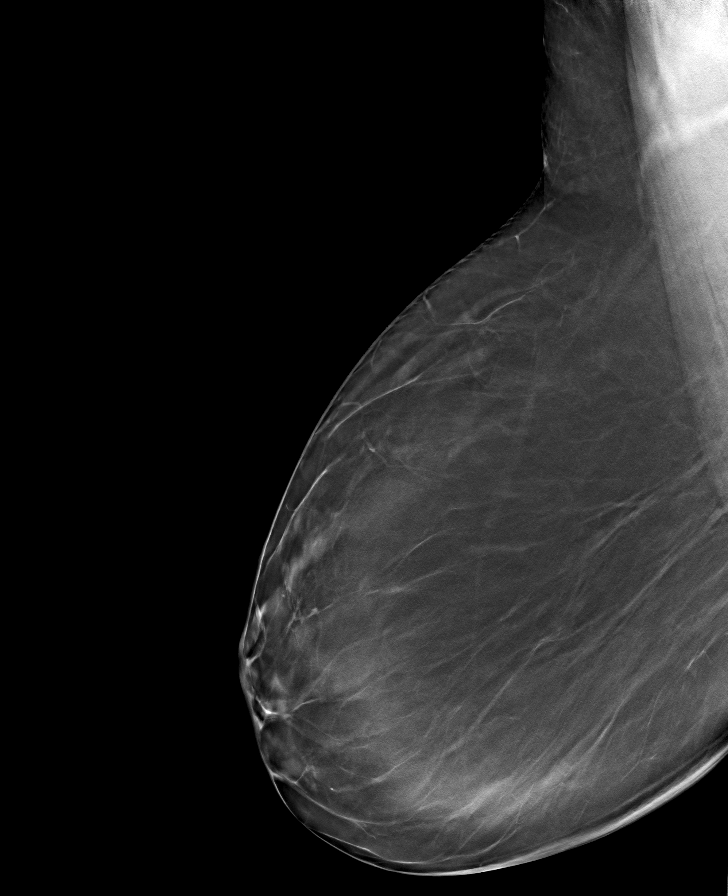

[L MLO tomo · tomo slice 47/92.0]
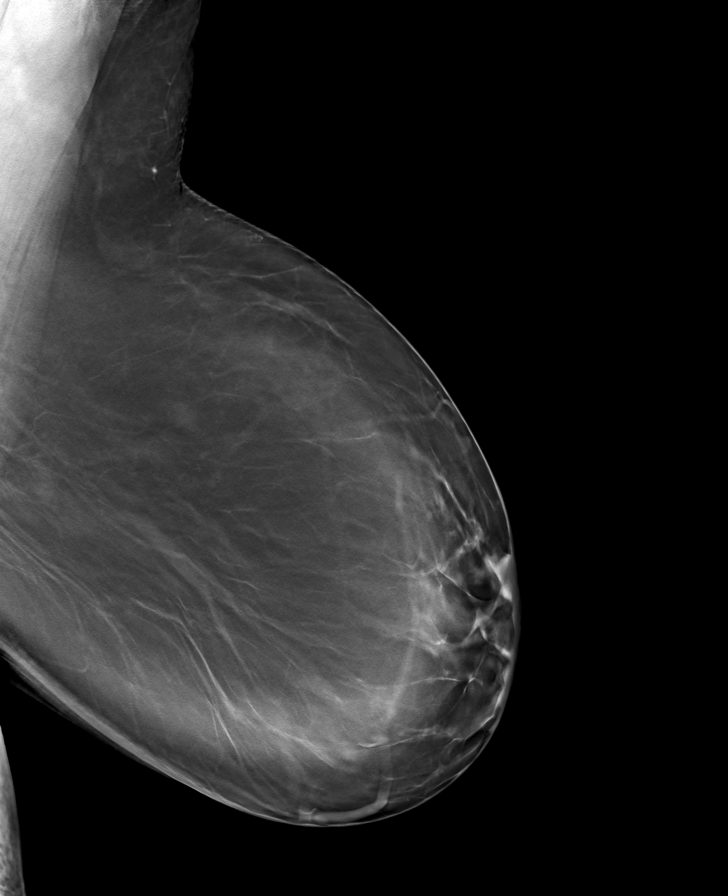

[L CC tomo · tomo slice 37/72.0]
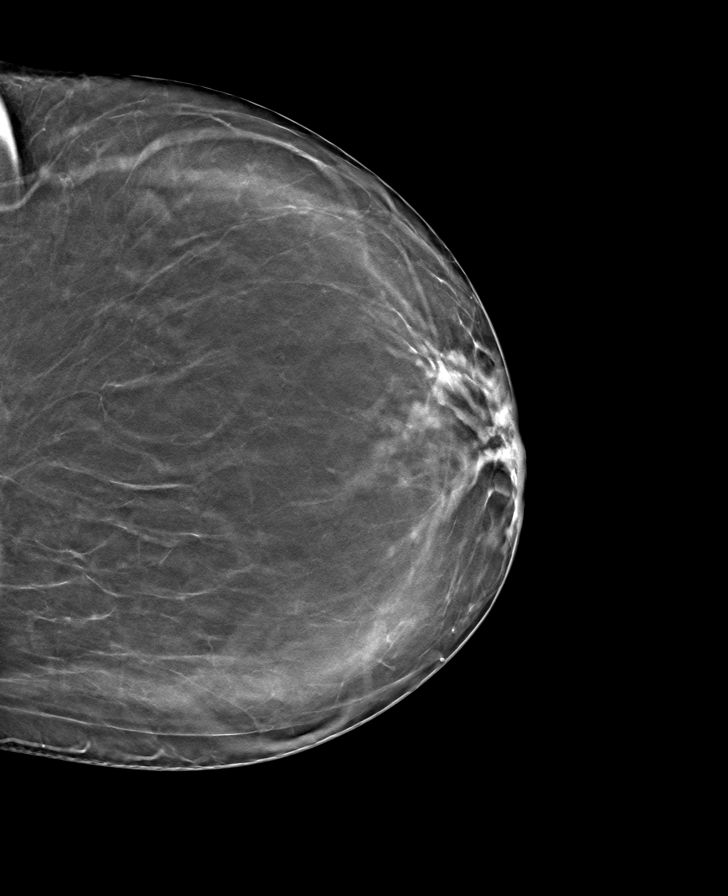

[R CC tomo · tomo slice 37/72.0]
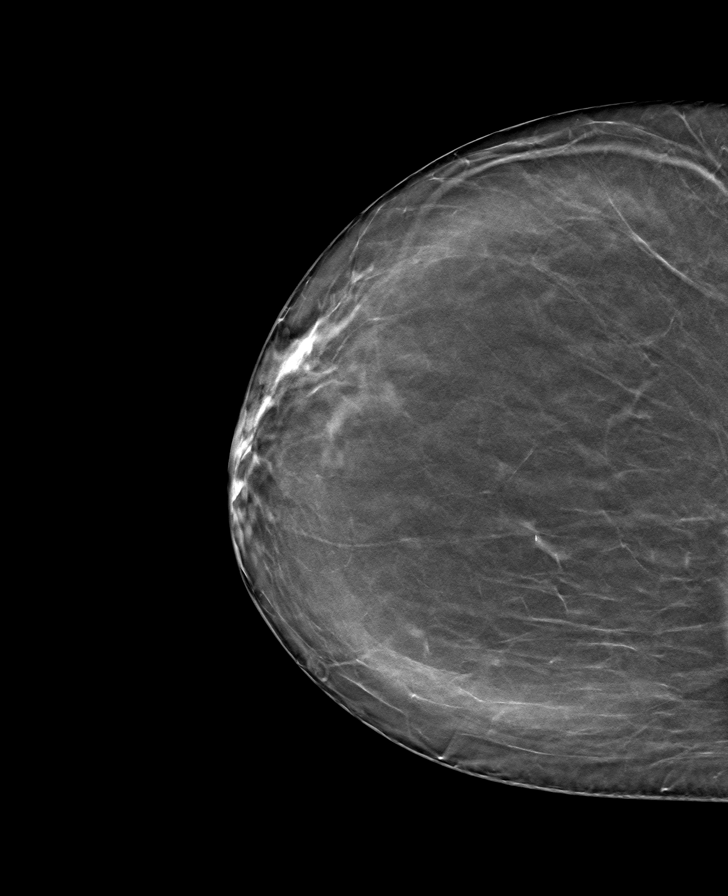

[8 of 24 positions shown; findings below may reference images not displayed]

ACR Breast Density Category b: There are scattered areas of
fibroglandular density.
FINDINGS: There are no findings suspicious for malignancy.
IMPRESSION: No mammographic evidence of malignancy. A result letter of this
screening mammogram will be mailed directly to the patient.

RECOMMENDATION:
Screening mammogram in one year. (Code:51-O-LD2)

BI-RADS CATEGORY  1: Negative.

## 2023-01-17 ENCOUNTER — Ambulatory Visit
Admission: RE | Admit: 2023-01-17 | Discharge: 2023-01-17 | Disposition: A | Discharge status: Home / Self Care | Payer: BC Managed Care – PPO | Source: Ambulatory Visit | Attending: Family Medicine | Admitting: Family Medicine

## 2023-01-17 DIAGNOSIS — Z1231 Encounter for screening mammogram for malignant neoplasm of breast: Secondary | ICD-10-CM | POA: Insufficient documentation

## 2023-03-07 DIAGNOSIS — N1831 Chronic kidney disease, stage 3a: Secondary | ICD-10-CM | POA: Insufficient documentation

## 2023-11-03 ENCOUNTER — Ambulatory Visit

## 2023-11-03 DIAGNOSIS — D123 Benign neoplasm of transverse colon: Secondary | ICD-10-CM | POA: Diagnosis not present

## 2023-11-03 DIAGNOSIS — K635 Polyp of colon: Secondary | ICD-10-CM | POA: Diagnosis not present

## 2023-11-03 DIAGNOSIS — K640 First degree hemorrhoids: Secondary | ICD-10-CM | POA: Diagnosis not present

## 2023-11-03 DIAGNOSIS — Z1211 Encounter for screening for malignant neoplasm of colon: Secondary | ICD-10-CM | POA: Diagnosis present

## 2023-12-23 ENCOUNTER — Other Ambulatory Visit: Payer: Self-pay | Admitting: Family Medicine

## 2023-12-23 DIAGNOSIS — Z1231 Encounter for screening mammogram for malignant neoplasm of breast: Secondary | ICD-10-CM

## 2023-12-26 ENCOUNTER — Encounter: Payer: Self-pay | Admitting: Podiatry

## 2023-12-26 ENCOUNTER — Ambulatory Visit: Admitting: Podiatry

## 2023-12-26 DIAGNOSIS — M722 Plantar fascial fibromatosis: Secondary | ICD-10-CM

## 2023-12-26 DIAGNOSIS — M76822 Posterior tibial tendinitis, left leg: Secondary | ICD-10-CM

## 2023-12-26 DIAGNOSIS — Q666 Other congenital valgus deformities of feet: Secondary | ICD-10-CM

## 2023-12-26 DIAGNOSIS — M76821 Posterior tibial tendinitis, right leg: Secondary | ICD-10-CM | POA: Diagnosis not present

## 2023-12-26 MED ORDER — MELOXICAM 15 MG PO TABS: 15.0000 mg | ORAL_TABLET | Freq: Every day | ORAL | 3 refills | Status: AC

## 2023-12-26 MED ORDER — TRIAMCINOLONE ACETONIDE 40 MG/ML IJ SUSP
40.0000 mg | Freq: Once | INTRAMUSCULAR | Status: AC
  Administered 2023-12-26: 40 mg

## 2023-12-26 NOTE — Progress Notes (Signed)
 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  Objective: Vital signs stable alert oriented x 3.  Pulses are palpable.  She has pain on palpation medial calcaneal tubercles bilateral.  Assessment: Plantar fasciitis bilateral.  Plan: I reinjected the bilateral heels today started her back on a meloxicam .

## 2024-02-21 ENCOUNTER — Ambulatory Visit
Admission: RE | Admit: 2024-02-21 | Discharge: 2024-02-21 | Disposition: A | Discharge status: Home / Self Care | Source: Ambulatory Visit | Attending: Family Medicine | Admitting: Family Medicine

## 2024-02-21 DIAGNOSIS — Z1231 Encounter for screening mammogram for malignant neoplasm of breast: Secondary | ICD-10-CM | POA: Insufficient documentation
